# Patient Record
Sex: Male | Born: 1960 | Race: White | Hispanic: No | Smoking: Current every day smoker
Health system: Southern US, Community
[De-identification: ages and names within clinical notes are randomized; demographics above are authoritative.]

## PROBLEM LIST (undated history)

## (undated) DIAGNOSIS — C801 Malignant (primary) neoplasm, unspecified: Secondary | ICD-10-CM

## (undated) DIAGNOSIS — I4891 Unspecified atrial fibrillation: Secondary | ICD-10-CM

## (undated) HISTORY — PX: TEMPOROMANDIBULAR JOINT SURGERY: SHX35

## (undated) HISTORY — PX: BELOW KNEE LEG AMPUTATION: SUR23

---

## 2005-06-13 ENCOUNTER — Emergency Department: Payer: Self-pay | Admitting: General Practice

## 2009-09-04 ENCOUNTER — Ambulatory Visit: Payer: Self-pay | Admitting: Pain Medicine

## 2011-02-16 ENCOUNTER — Emergency Department: Payer: Self-pay | Admitting: Internal Medicine

## 2011-08-21 ENCOUNTER — Emergency Department: Payer: Self-pay | Admitting: Unknown Physician Specialty

## 2011-08-21 LAB — URINALYSIS, COMPLETE
Bacteria: NONE SEEN
Bilirubin,UR: NEGATIVE
Glucose,UR: NEGATIVE mg/dL (ref 0–75)
Ketone: NEGATIVE
Leukocyte Esterase: NEGATIVE
Ph: 7 (ref 4.5–8.0)
Protein: NEGATIVE
RBC,UR: 1 /HPF (ref 0–5)
Squamous Epithelial: NONE SEEN
WBC UR: NONE SEEN /HPF (ref 0–5)

## 2011-08-21 LAB — BASIC METABOLIC PANEL
BUN: 15 mg/dL (ref 7–18)
Calcium, Total: 9.4 mg/dL (ref 8.5–10.1)
Chloride: 101 mmol/L (ref 98–107)
Co2: 31 mmol/L (ref 21–32)
EGFR (African American): >60
EGFR (Non-African Amer.): >60
Glucose: 89 mg/dL (ref 65–99)
Potassium: 3.6 mmol/L (ref 3.5–5.1)
Sodium: 138 mmol/L (ref 136–145)

## 2011-08-21 LAB — CBC
HCT: 49.5 % (ref 40.0–52.0)
MCH: 31.3 pg (ref 26.0–34.0)
MCHC: 34 g/dL (ref 32.0–36.0)
Platelet: 86 10*3/uL — ABNORMAL LOW (ref 150–440)
RDW: 13.7 % (ref 11.5–14.5)

## 2011-08-21 LAB — HEPATIC FUNCTION PANEL A (ARMC)
Albumin: 3.9 g/dL (ref 3.4–5.0)
Alkaline Phosphatase: 144 U/L — ABNORMAL HIGH (ref 50–136)
Bilirubin, Direct: <0.1 mg/dL (ref 0.00–0.20)
Bilirubin,Total: 0.6 mg/dL (ref 0.2–1.0)
SGPT (ALT): 27 U/L
Total Protein: 7.4 g/dL (ref 6.4–8.2)

## 2011-08-21 LAB — TROPONIN I: Troponin-I: <0.02 ng/mL

## 2011-08-21 LAB — CK TOTAL AND CKMB (NOT AT ARMC)
CK, Total: 191 U/L (ref 35–232)
CK-MB: 1.1 ng/mL (ref 0.5–3.6)

## 2012-03-22 ENCOUNTER — Inpatient Hospital Stay: Payer: Self-pay | Admitting: Family Medicine

## 2012-03-22 LAB — COMPREHENSIVE METABOLIC PANEL
Anion Gap: 10 (ref 7–16)
BUN: 21 mg/dL — ABNORMAL HIGH (ref 7–18)
Calcium, Total: 8.6 mg/dL (ref 8.5–10.1)
Co2: 20 mmol/L — ABNORMAL LOW (ref 21–32)
EGFR (African American): 58 — ABNORMAL LOW
EGFR (Non-African Amer.): 50 — ABNORMAL LOW
Glucose: 103 mg/dL — ABNORMAL HIGH (ref 65–99)
SGPT (ALT): 36 U/L (ref 12–78)
Sodium: 137 mmol/L (ref 136–145)
Total Protein: 7.6 g/dL (ref 6.4–8.2)

## 2012-03-22 LAB — CBC
HCT: 47.1 % (ref 40.0–52.0)
HGB: 16.3 g/dL (ref 13.0–18.0)
MCH: 31.7 pg (ref 26.0–34.0)
MCHC: 34.6 g/dL (ref 32.0–36.0)
MCV: 92 fL (ref 80–100)
RBC: 5.14 10*6/uL (ref 4.40–5.90)

## 2012-03-22 LAB — URINALYSIS, COMPLETE
Bacteria: NONE SEEN
Glucose,UR: NEGATIVE mg/dL (ref 0–75)
Ketone: NEGATIVE
Leukocyte Esterase: NEGATIVE
Specific Gravity: 1.003 (ref 1.003–1.030)

## 2012-03-22 LAB — CK TOTAL AND CKMB (NOT AT ARMC)
CK, Total: 71 U/L (ref 35–232)
CK-MB: <0.5 ng/mL — ABNORMAL LOW (ref 0.5–3.6)

## 2012-03-23 LAB — CBC WITH DIFFERENTIAL/PLATELET
Basophil #: 0 10*3/uL (ref 0.0–0.1)
Basophil %: 0.2 %
Eosinophil #: 0 10*3/uL (ref 0.0–0.7)
Eosinophil %: 0 %
HCT: 43.8 % (ref 40.0–52.0)
HGB: 14.7 g/dL (ref 13.0–18.0)
Lymphocyte #: 0.9 10*3/uL — ABNORMAL LOW (ref 1.0–3.6)
Lymphocyte %: 7.3 %
MCH: 31.4 pg (ref 26.0–34.0)
Monocyte #: 0.3 x10 3/mm (ref 0.2–1.0)
Monocyte %: 2.3 %
Neutrophil %: 90.2 %
Platelet: 146 10*3/uL — ABNORMAL LOW (ref 150–440)
RBC: 4.69 10*6/uL (ref 4.40–5.90)
WBC: 12.8 10*3/uL — ABNORMAL HIGH (ref 3.8–10.6)

## 2012-03-23 LAB — COMPREHENSIVE METABOLIC PANEL
Anion Gap: 8 (ref 7–16)
Calcium, Total: 8.7 mg/dL (ref 8.5–10.1)
Co2: 21 mmol/L (ref 21–32)
EGFR (African American): >60
EGFR (Non-African Amer.): >60
Potassium: 4.3 mmol/L (ref 3.5–5.1)
SGOT(AST): 17 U/L (ref 15–37)
Sodium: 140 mmol/L (ref 136–145)
Total Protein: 6.9 g/dL (ref 6.4–8.2)

## 2012-03-23 LAB — CK TOTAL AND CKMB (NOT AT ARMC)
CK, Total: 48 U/L (ref 35–232)
CK-MB: <0.5 ng/mL — ABNORMAL LOW (ref 0.5–3.6)

## 2012-03-23 LAB — APTT
Activated PTT: 154.5 secs — ABNORMAL HIGH (ref 23.6–35.9)
Activated PTT: 101.2 secs — ABNORMAL HIGH (ref 23.6–35.9)

## 2013-01-03 ENCOUNTER — Emergency Department: Payer: Self-pay | Admitting: Internal Medicine

## 2014-07-01 NOTE — Consult Note (Signed)
Brief Consult Note: Diagnosis: tobacco abuse/htn/hyperlipdemia/anxiety/cough/afib.   Patient was seen by consultant.   Recommend further assessment or treatment.   Comments: 54 yo male with history of multiple medical problems including pvd s/p bilateral le amputations, history of paroxysmal afib followed closely by Dr. Willis Modena at [facility] who was admitted with abdominal pain, couigh, diarrhea, shortness of breath. He was noted to be in afib with fvr. He takes flecainaide for his afib dosing it q 6 hours. He states he is compliant. He was offerred ablation and has deferred thus far but states today he wants me to contact Dr. Willis Modena and discuss afib ablation. Will review chart and meds and attempt to contact Dr. Willis Modena. Full note to follow. Would continue with current meds except will discontinue digoxin and attempt to use metoprolol while on flecainide due to possible 1:1 conduction if patient develops flutter on flecainide..  Electronic Signatures: Teodoro Spray (MD)  (Signed 13-Jan-14 15:30)  Authored: Brief Consult Note   Last Updated: 13-Jan-14 15:30 by Teodoro Spray (MD)

## 2014-07-01 NOTE — Discharge Summary (Signed)
PATIENT NAME:  Dylan Clark, Dylan Clark MR#:  [id number] DATE OF BIRTH:  1960/12/08  DATE OF ADMISSION:  03/22/2012 DATE OF DISCHARGE:  03/24/2012  ADMISSION DIAGNOSIS AND CHIEF COMPLAINT: Nausea, vomiting, diarrhea and tachycardia.   FINAL DIAGNOSES: 1. Atrial fibrillation with rapid ventricular response.  2. Dehydration.  3. Acute kidney failure.  4. Chest pain and shortness of breath.  5. Ongoing tobacco abuse.  6. Chronic joint pain.  7. Anxiety disorder and also some PTSD component.   CONSULTANT: Bartholome Bill, MD  DISCHARGE MEDICATIONS:  1. Alprazolam 1 mg 4 times a day. 2. Enalapril 5 mg 4 times a day. 3. Hydroxyzine 25 mg once daily. 4. Promethazine 25 mg twice daily. 5. Simvastatin 40 mg once daily. 6. Afrin p.r.n.  7. Multivitamins once daily.  8. Furosemide 20 mg 2 tablets 4 times a day. 9. Zantac 150 mg as needed.  10. Testosterone injections once a week. 11. Flecainide 100 mg every 12 hours. 12. Morphine sulfate 15 mg 12 hour release take one every 8 hours.  13. Metoprolol 25 mg twice daily. 14. Advair Diskus 250/50 mg twice daily. 15. Zithromax 500 mg once a day for another 8 days. 16. Xarelto 20 mg once a day.   IMPORTANT RESULTS: Echocardiogram showed left ventricular grossly normal size, no thrombus, ejection fraction of 55% and normal left ventricular wall thickness.   Admission labs: Creatinine 1.57, glucose 103 and BUN 21. Electrolytes normal with sodium 137, potassium 3.5 and bilirubin of 1.2. Troponins were all negative x 3. White count was 16,000 on admission with a hemoglobin of 16 and platelets of 78.   Urinalysis was within normal limits.   Followup labs: White blood count came down to 12.8. As mentioned above, Troponins x 3 were negative. His glucose was 187 at discharge and his creatinine normalized to 1.16. His platelets improved from 78 down to 146.   Chest x-ray shows mostly changes of COPD, maybe acute bronchitis, but no pneumonia.   HOSPITAL COURSE:  Mr. Kozub is very nice 54 year old gentleman with history of degenerative disk disease, hypertension, hyperlipidemia, chronic atrial fibrillation on propafenone, tobacco abuse, amputee and peripheral vascular disease who presented to the hospital with the complaint of feeling not so well, having nausea, vomiting and diarrhea and being dehydrated. The problem started 4 days prior to the admission. He was trying not to overdo things and just relax, but overall became dehydrated for not being able to drink plenty fluids. The patient started having some increased pain. He has chronic pain, especially in his back, but the pain became a little bit more intolerable. He also noticed some palpitations, chest tightness and his heart rate was increased and irregular. He has history of atrial fibrillation and occasionally has palpitations this way, although they go away quickly. This time they were sustained. The patient started to become dizzy and because of all of this decided to come to the ER. In the ER, he was found to be short of breath requiring 2 liters of oxygen and his heart rate was around 130 for what he was admitted for control of his symptoms. He was found to have low blood pressures of 54/42 and a heart rate of 135. His temperature was 98.1 on admission. He looked dehydrated. IV fluids were given and he was put on antibiotics. There was a question about the patient possibly having a pulmonary embolism due to hypoxemia, hypotension and tachycardia. VQ scan was performed and he was put on a heparin drip. His platelets  were very decreased prior to starting heparin drip, but on repeat platelet count actually they were better at 146. Prior to that they were in the 78. The patient's VQ scan showed homogeneous ventilation with homogenous perfusion and no mismatch for what it was interpreted as negative or normal VQ scan. The patient was able to be weaned off oxygen quickly and his blood pressure improved after the IV  fluids were given. Apparently he was having diarrhea for 4 or 5 days and not hydrating himself very well, but IV fluids and oral hydration seemed to improve his problem. His blood pressure medications were held, but at discharge his blood pressures were 160s over 94 for what we restarted medications. He was taking propafenone, Inderal and enalapril. Due to these problems and his atrial fibrillation being present again, the patient was changed from Inderal to metoprolol and continued propafenone. Dr. Ubaldo Glassing was kind to see the patient on this admission. He discussed the case with Dr. Willis Modena as far possible ablation for this patient and the patient is going to be referred back to [facility] for this. The patient also had transformation back to normal sinus rhythm during this hospitalization. Due to all of this and to possible ablation, Dr. Ubaldo Glassing decided to put the patient on anticoagulation. We talked about this extensively. The patient has a very low CHADS score of 1, based on hypertension. He also has peripheral arterial disease, but he does not have significant diabetes or CHF and he is young, 54 years old. Dr. Ubaldo Glassing thought that it would be beneficial to anticoagulate him if he is going to be having an ablation for which the patient is discharged on Xarelto.   TIME SPENT: I spent about 95 minutes with this patient yesterday trying to arrange his care and also talking with insurance for prior authorization of his medications.  ____________________________ [place] Is[place]ro Sink, MD rsg:sb D: 03/25/2012 06:51:56 ET T: 03/25/2012 08:56:42 ET JOB#: [id number]  cc: [place] Sink, MD, <Dictator> Guadalupe Maple, MD Dr. Willis Modena [facility] Cardiology Delanie Tirrell America Brown MD ELECTRONICALLY SIGNED 04/03/2012 10:58

## 2014-07-01 NOTE — H&P (Signed)
PATIENT NAME:  Dylan Clark, Dylan Clark MR#:  [id number] DATE OF BIRTH:  May 15, 1960  DATE OF ADMISSION:  03/22/2012  PRIMARY CARE PHYSICIAN: [facility], Dr. Jory Sims  HISTORY OF PRESENT ILLNESS: The patient is a 54 year old Caucasian male with past medical history significant for history of degenerative disk disease, hypertension, hyperlipidemia, history of chronic a fib, tobacco abuse, presented to the hospital with complaints of not feeling well. Apparently the patient started having nausea, vomiting, as well as diarrhea and fever approximately 4 days ago. He felt somewhat better on Friday, a day or two ago, however still very weak. He was able to get out from the house; however, after returning back he had recurrent nausea and vomiting. Yesterday, he was taking it easy, however, walked a little bit to his friend's house. While at the friend's house, he started having problems with increasing back pain. The pain was so uncomfortable that he had to return back home. At home, he noted to have also chest pains, tightness in the chest, as well as increased heart rate and irregular heart rate. He has history of atrial fibrillation and frequently feels whenever his heart rate goes from sinus to atrial fibrillation. He felt very dizzy and very dry. He drank plenty of fluids, however, decided to come to the Emergency Room with continuous chest pains which he describes as pleuritic chest pains, increasing with deep inspiration. He was also having some shortness of breath, especially if he tried to lay down flat; he was short of breath and felt discomfort in his chest. He was able to sleep for a few hours on 2 pillows; however, because of discomfort in his chest as well as shortness of breath he decided to come to the Emergency Room for further evaluation. In the Emergency Room, he was noted to be in atrial fib, rate of 130. His systolic blood pressure was also in the 50s, and Hospitalist services were contacted for admission.  Now after 4 liters of IV fluid infusion, his heart rate remains high at 017P, and his systolic blood pressure remains low at around 85 maximum level. His oxygenation is satisfactory. It was 95% on room to 2 liters of oxygen through nasal cannula. He does not feel any better yet, and he is complaining of some chest pains as well as some back pains in the lower back.   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 The patient was admitted in December 2012 at [facility] for afib. He had a stress test done at the same time which was negative. He also had right TMJ surgery, history of deviated septum of his nose and nasal congestion intermittently, dependence on Afrin spray.   PAST SURGICAL HISTORY: As above.   ALLERGIES: CODEINE, WHICH GIVES HIM, STOMACH CRAMPS.   FAMILY HISTORY: The patient's sister had diabetes at the age of 73. The patient's father had CVA at the age of 39, died at the age of 39. The patient's mother has dementia, died at age of 25. The patient's sister at 69 has dementia. The  patient's brother had a kidney transplant for unknown reason. The patient's other brother had coronary artery bypass grafting at the age of 45.   SOCIAL HISTORY: The patient is divorced x 2. Smokes 1-1/2 packs a day for 17 years.  He used to be drunk in the past, however, stopped drinking a long time ago. He is on Disability. 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   REVIEW OF SYSTEMS: Positive for high fevers, started approximately 4 days ago. Even today he had fevers at 100.8 as well as chills here in the hospital, feeling fatigued and weak, having pains in the chest as well as pains in the lower back, some blurring of vision and double vision intermittently which he attributes to a cataract on the right side, a deviated septum in his nose, also sinus congestion and will be hooked up to nasal spray, according to him, some cough with white phlegm, as well as shortness of breath and tightness in his chest, atrial fibrillation, arrhythmias and feeling presyncopal today, nausea and vomiting intermittently, intermittent lower extremity edema for which he is taking Lasix, however, he has been taking Lasix also even if he is not able to eat or drink well over the past 4 days. He admits of intermittent rectal bleeding which he attributes to in hemorrhoids. He admits of having some problems with urination, difficulty expelling urine due to  Peyronie's disease.  He has back problems, cervical as well as lumbar spine disk problems, degenerative disk disease, also bulging disk which contributes to upper extremity weakness, as well as lower extremity weakness, as well as numbness and pain. Otherwise:   CONSTITUTIONAL: Denies any weight problems with loss or gain.  EYES: In regards to eyes, denies any redness, inflammation, or glaucoma.  ENT: Denies any tinnitus, allergies, epistaxis, sinus pain, dentures, difficulty swallowing. RESPIRATORY: Denies any wheezes, hemoptysis, asthma or COPD.  CARDIOVASCULAR: Denies any orthopnea, dyspnea on exertion. Admits of palpitations as well as feeling presyncopal, however, admits of sleeping on 2 pillows earlier today.  GASTROINTESTINAL: Denies any hematemesis, constipation. Admits of having recent nausea, vomiting, and diarrhea.  GENITOURINARY: Denies dysuria, hematuria, frequency or  incontinence. ENDOCRINOLOGY: Denies any polydipsia, nocturia, thyroid problems, heat or cold intolerance or thirst.  HEMATOLOGIC: Denies any anemia, easy bruising, bleeding, or swollen glands.  SKIN: Denies any acne, rashes, lesions, or change in moles.  MUSCULOSKELETAL: Denies arthritis, cramps, swelling.  NEUROLOGIC: Denies numbness, epilepsy, or tremor.  PSYCHIATRIC: Denies anxiety or insomnia.    PHYSICAL EXAMINATION: VITAL SIGNS: On arrival to the hospital, temperature was 98.1, pulse 135, respiration rate 24, blood pressure 54/42, saturation 95% on room.  GENERAL: This is a well-developed, well-nourished obese Caucasian male in no significant distress, comfortable on the stretcher.   HEENT: Her pupils are equal and reactive to light. Extraocular movements are intact. No icterus or conjunctivitis.  Has normal hearing.  No pharyngeal erythema. Mucosa is moist.  NECK: No masses, supple, nontender. Thyroid is not enlarged. No adenopathy. No JVD or carotid bruits bilaterally. Full range of motion.  LUNGS: Diminished breath sounds. A few crackles were heard bilaterally at bases, however, no rhonchi or wheezing. No labored inspirations. No increased effort. No dullness to percussion, not in overt respiratory distress.  CARDIOVASCULAR: S1, S2 appreciated, distant, irregularly irregular. PMI is not lateralized. Chest is nontender to palpation. Pedal pulses 1+, no lower extremity edema, calf tenderness or cyanosis. The patient does have, however, a right BKA  and prosthesis. ABDOMEN: Soft, nontender, bowel sounds are present.  No splenomegaly or masses were noted.  RECTAL: Deferred. MUSCULOSKELETAL: Muscle strength: Able to move all extremities.  No cyanosis, degenerative joint disease or kyphosis noted.  The patient does have significant pain on palpation of his spine, especially lower thoracic as well as lumbar spine all the way to the lower coccygeal area.  SKIN: Did not reveal any rashes, lesions,  erythema, nodularity, indurations. It was warm and dry to palpation.  No adenopathy in the cervical region.  NEUROLOGICAL:  Cranial nerves were intact.  No dysarthria or aphasia.   PSYCHIATRIC: The patient is alert and oriented to time, person and place, cooperative. Memory is good.  No significant confusion, agitation or depression noted.    LABORATORY, DIAGNOSTIC AND RADIOLOGICAL DATA:  Glucose 103, BUN and creatinine were 21 and 1.57, otherwise BMP was unremarkable, however, the patient's bicarbonate level is low at 20. Estimated GFR for non-African American would be 50. Liver enzymes revealed total bilirubin of 1.2, otherwise liver enzymes were unremarkable. CK total was 71, MB fraction was less than 0.5, and troponin was less than 0.02, all of them normal. White blood cell count was elevated to 16.0, hemoglobin was 16.3, platelet count 78. Urinalysis: Straw clear urine, negative for glucose, bilirubin or ketones, specific gravity 1.003, pH was 6.0, negative for blood, protein, nitrites or leukocyte esterase, no red blood cells, no white blood cells, no bacteria or epithelial cells were noted. Influenza test was negative for influenza A as well as B.   EKG revealed afib at a rate of 126, left axis deviation, nonspecific ST-T changes. No acute changes were noted. Chest x-ray, portable single view on 03/22/2012, revealed no definite evidence of acute cardiopulmonary abnormality. Cannot exclude minimal subsegmental atelectasis bilaterally at the left lung base, according to the radiologist.   ASSESSMENT AND PLAN: 1. Chest pain: Admit the patient to a medical floor. It is pleuritic, according to the patient's evaluation. The patient apparently had a stress test in December 2012 at [facility], and it was negative, per his recollection. I am admitting him for questionable pulmonary embolism evaluation. We will start the patient on heparin IV drip, and we will get V/Q scan in the morning. We will continue heparin  for now.  2. Hypoxia, questionable chronic obstructive pulmonary disease: The patient does have a long history of tobacco abuse. We will add Advair as well as DuoNebs and steroids. We will continue oxygen therapy for now.  3. Atrial fibrillation, rapid ventricular response: We will continue flecainide. We  will also add Digoxin. I was not able to use calcium channel blockers or beta blockers due to hypotension. We may need to start the patient on an amiodarone drip.  4. Profound hypertension: Questionable sepsis versus PE-related. We will get blood cultures done. We will continue IV fluids at a high rate. We will also start the patient on antibiotic therapy. We will get V/Q scan and will continue heparin to rule out pulmonary embolism. The patient may need pressors if his MAP is less than 60.  5. Acute renal failure: We will continue IV fluids. We will check urine cultures as well.  6. Back pain: We will continue morphine as needed.  7. Tobacco abuse: Nicotine replacement therapy will be initiated. This was discussed with the patient for 5 minutes, and he wanted to quit.     MEDICATIONS:   1. Afrin 1 spray once a day.  2. Alprazolam 1 mg 4 times daily.  3. Enalapril 5 mg p.o. 4 times daily.  4. Flecainide 100 mg p.o. twice daily.  5. Furosemide 20 mg, 2 tablets 4 times day.  6. Hydroxyzine hydrochloride 25 mg p.o. once daily as needed.  7. Morphine, which is MS Contin, 15 mg p.o. every 8 hours.  8. Multivitamin once daily.  9. Promethazine 25 mg p.o. twice daily as needed.  10. Simvastatin 40 mg p.o. daily.  11. Testosterone 1 mL injectable once a week, which is 200 mg.  12. Zantac 150 mg p.o. daily as needed.   TIME SPENT: One hour.   ____________________________ Theodoro Grist, MD rv:cb D: 03/22/2012 16:26:00 ET T: 03/22/2012 18:17:27 ET JOB#: [id number]  cc: Theodoro Grist, MD, <Dictator> [facility], Jory Sims, MD [place] MD ELECTRONICALLY SIGNED 04/30/2012 14:13

## 2014-08-15 ENCOUNTER — Encounter: Payer: Self-pay | Admitting: *Deleted

## 2014-08-15 ENCOUNTER — Emergency Department
Admission: EM | Admit: 2014-08-15 | Discharge: 2014-08-15 | Disposition: A | Discharge status: Home / Self Care | Payer: Medicare Other | Attending: Emergency Medicine | Admitting: Emergency Medicine

## 2014-08-15 DIAGNOSIS — R339 Retention of urine, unspecified: Secondary | ICD-10-CM | POA: Diagnosis present

## 2014-08-15 DIAGNOSIS — Z880 Allergy status to penicillin: Secondary | ICD-10-CM | POA: Insufficient documentation

## 2014-08-15 DIAGNOSIS — R338 Other retention of urine: Secondary | ICD-10-CM

## 2014-08-15 DIAGNOSIS — Z720 Tobacco use: Secondary | ICD-10-CM | POA: Insufficient documentation

## 2014-08-15 HISTORY — DX: Malignant (primary) neoplasm, unspecified: C80.1

## 2014-08-15 HISTORY — DX: Unspecified atrial fibrillation: I48.91

## 2014-08-15 LAB — URINALYSIS COMPLETE WITH MICROSCOPIC (ARMC ONLY)
BILIRUBIN URINE: NEGATIVE
Bacteria, UA: NONE SEEN
GLUCOSE, UA: NEGATIVE mg/dL
Ketones, ur: NEGATIVE mg/dL
Leukocytes, UA: NEGATIVE
NITRITE: NEGATIVE
PROTEIN: 30 mg/dL — AB
Specific Gravity, Urine: 1.011 (ref 1.005–1.030)
pH: 6 (ref 5.0–8.0)

## 2014-08-15 MED ORDER — LIDOCAINE HCL 2 % EX GEL
CUTANEOUS | Status: AC
  Filled 2014-08-15: qty 5

## 2014-08-15 MED ORDER — OXYCODONE-ACETAMINOPHEN 5-325 MG PO TABS: 2.0000 | ORAL_TABLET | Freq: Once | ORAL | Status: DC

## 2014-08-15 MED ORDER — LIDOCAINE HCL 2 % EX GEL
CUTANEOUS | Status: AC
  Administered 2014-08-15: 21:00:00
  Filled 2014-08-15: qty 10

## 2014-08-15 MED ORDER — OXYCODONE HCL 5 MG PO TABS
10.0000 mg | ORAL_TABLET | Freq: Once | ORAL | Status: AC
  Administered 2014-08-15: 10 mg via ORAL

## 2014-08-15 MED ORDER — OXYCODONE HCL 5 MG PO TABS
ORAL_TABLET | ORAL | Status: AC
  Administered 2014-08-15: 10 mg via ORAL
  Filled 2014-08-15: qty 2

## 2014-08-15 NOTE — ED Notes (Signed)
Pt was seen at [facility] for an appointment, pt dx: enlarged prostate, pt given antibiotic, pt unable to urinate since 1400

## 2014-08-15 NOTE — ED Provider Notes (Signed)
[facility] Emergency Department Provider Note     Time seen: ----------------------------------------- 8:37 PM on 08/15/2014 -----------------------------------------    I have reviewed the triage vital signs and the nursing notes.   HISTORY  Chief Complaint Urinary Retention    HPI Dylan Clark is a 54 y.o. male resents ER with acute urinary retention. Patient states last urinated around 2:00. Has some abdominal pain secondary to same. Earlier he urinated mostly blood. Complains of lower abdominal discomfort, nothing makes it better or worse at this time. Has had have a Foley catheter in the past after procedures. Was seen at [facility] for an appointment and treat for prostatitis is on Levaquin currently   Past Medical History  Diagnosis Date  . Atrial fibrillation   . Cancer     There are no active problems to display for this patient.   No past surgical history on file.  Allergies Amoxicillin; Augmentin; Fish allergy; Adhesive; Codeine; Mirtazapine; Niacin and related; and Tylenol  Social History History  Substance Use Topics  . Smoking status: Current Every Day Smoker -- 1.00 packs/day  . Smokeless tobacco: Not on file  . Alcohol Use: No    Review of Systems Constitutional: Negative for fever. Eyes: Negative for visual changes. ENT: Negative for sore throat. Cardiovascular: Negative for chest pain. Respiratory: Negative for shortness of breath. Gastrointestinal: Negative for abdominal pain, vomiting and diarrhea. Genitourinary: Inability to urinate Musculoskeletal: Negative for back pain. Skin: Negative for rash. Neurological: Negative for headaches, focal weakness or numbness.  10-point ROS otherwise negative.  ____________________________________________   PHYSICAL EXAM:  VITAL SIGNS: ED Triage Vitals  Enc Vitals Group     BP 08/15/14 2027 139/86 mmHg     Pulse Rate 08/15/14 2027 71     Resp 08/15/14 2027 20     Temp  08/15/14 2027 98.7 F (37.1 C)     Temp Source 08/15/14 2027 Oral     SpO2 08/15/14 2027 99 %     Weight 08/15/14 2027 288 lb (130.636 kg)     Height 08/15/14 2027 6\' 6"  (1.981 m)     Head Cir --      Peak Flow --      Pain Score 08/15/14 2028 7     Pain Loc --      Pain Edu? --      Excl. in [place] Constitutional: Alert and oriented. Well appearing and in no distress. Eyes: Conjunctivae are normal. PERRL. Normal extraocular movements. ENT   Head: Normocephalic and atraumatic.   Nose: No congestion/rhinnorhea.   Mouth/Throat: Mucous membranes are moist.   Neck: No stridor. Hematological/Lymphatic/Immunilogical: No cervical lymphadenopathy. Cardiovascular: Normal rate, regular rhythm. Normal and symmetric distal pulses are present in all extremities. No murmurs, rubs, or gallops. Respiratory: Normal respiratory effort without tachypnea nor retractions. Breath sounds are clear and equal bilaterally. No wheezes/rales/rhonchi. Gastrointestinal: Soft and nontender. No distention. No abdominal bruits. There is no CVA tenderness. Musculoskeletal: Nontender with normal range of motion in all extremities. No joint effusions.  No lower extremity tenderness nor edema. Neurologic:  Normal speech and language. No gross focal neurologic deficits are appreciated. Speech is normal. No gait instability. Skin:  Skin is warm, dry and intact. No rash noted. Psychiatric: Mood and affect are normal. Speech and behavior are normal. Patient exhibits appropriate insight and judgment.   ____________________________________________  ED COURSE:  Pertinent labs & imaging results that were available during my care of the patient were reviewed  by me and considered in my medical decision making (see chart for details). Patient will need Foley catheter  Foley catheter placed, copious urine output. ____________________________________________    LABS (pertinent positives/negatives)  Labs  Reviewed  URINALYSIS COMPLETEWITH MICROSCOPIC ([facility] ONLY) - Abnormal; Notable for the following:    Color, Urine YELLOW (*)    APPearance CLOUDY (*)    Hgb urine dipstick 3+ (*)    Protein, ur 30 (*)    Squamous Epithelial / LPF 0-5 (*)    All other components within normal limits    RADIOLOGY  None  ____________________________________________  FINAL ASSESSMENT AND PLAN  Acute urinary retention  Plan: Patient will continue home with Foley catheter. He states she has follow-up with internal medicine [facility] on Thursday. Perhaps they could reevaluate him and see if he can get his Foley catheter out at that time. He'll continue on Levaquin that he was placed on the day, a stable for outpatient follow-up.   Earleen Newport, MD   Earleen Newport, MD 08/15/14 [phone]

## 2014-08-15 NOTE — Discharge Instructions (Signed)
Acute Urinary Retention  Acute urinary retention is when you are unable to pee (urinate). Acute urinary retention is common in older men. Prostates can get bigger, which blocks the flow of pee.   HOME CARE  · Drink enough fluids to keep your pee clear or pale yellow.  · If you are sent home with a tube that drains the bladder (catheter), there will be a drainage bag attached to it. There are two types of bags. One is big that you can wear at night without having to empty it. One is smaller and needs to be emptied more often.  ¨ Keep the drainage bag empty.  ¨ Keep the drainage bag lower than your catheter.  · Only take medicine as told by your doctor.  GET HELP IF:  · You have a low-grade fever.  · You have spasms or you are leaking pee when you have spasms.  GET HELP RIGHT AWAY IF:   · You have chills or a fever.  · Your catheter stops draining pee.  · Your catheter falls out.  · You have increased bleeding that does not stop after you have rested and increased the amount of fluids you had been drinking.  MAKE SURE YOU:   · Understand these instructions.  · Will watch your condition.  · Will get help right away if you are not doing well or get worse.  Document Released: 08/14/2007 Document Revised: 12/16/2012 Document Reviewed: 08/06/2012  [employer]® Patient Information ©2015 [employer]. This information is not intended to replace advice given to you by your health care provider. Make sure you discuss any questions you have with your health care provider.

## 2015-10-04 ENCOUNTER — Emergency Department
Admission: EM | Admit: 2015-10-04 | Discharge: 2015-10-04 | Disposition: A | Discharge status: Home / Self Care | Payer: Medicare Other | Attending: Emergency Medicine | Admitting: Emergency Medicine

## 2015-10-04 ENCOUNTER — Emergency Department: Payer: Medicare Other

## 2015-10-04 ENCOUNTER — Encounter: Payer: Self-pay | Admitting: Emergency Medicine

## 2015-10-04 DIAGNOSIS — Y929 Unspecified place or not applicable: Secondary | ICD-10-CM | POA: Insufficient documentation

## 2015-10-04 DIAGNOSIS — F17200 Nicotine dependence, unspecified, uncomplicated: Secondary | ICD-10-CM | POA: Insufficient documentation

## 2015-10-04 DIAGNOSIS — S93601A Unspecified sprain of right foot, initial encounter: Secondary | ICD-10-CM | POA: Insufficient documentation

## 2015-10-04 DIAGNOSIS — Y999 Unspecified external cause status: Secondary | ICD-10-CM | POA: Insufficient documentation

## 2015-10-04 DIAGNOSIS — M79671 Pain in right foot: Secondary | ICD-10-CM | POA: Diagnosis present

## 2015-10-04 DIAGNOSIS — Y9301 Activity, walking, marching and hiking: Secondary | ICD-10-CM | POA: Diagnosis not present

## 2015-10-04 DIAGNOSIS — X509XXA Other and unspecified overexertion or strenuous movements or postures, initial encounter: Secondary | ICD-10-CM | POA: Diagnosis not present

## 2015-10-04 DIAGNOSIS — Z8551 Personal history of malignant neoplasm of bladder: Secondary | ICD-10-CM | POA: Diagnosis not present

## 2015-10-04 MED ORDER — OXYCODONE HCL 5 MG PO CAPS: 5.0000 mg | ORAL_CAPSULE | ORAL | 0 refills | Status: AC | PRN

## 2015-10-04 MED ORDER — KETOROLAC TROMETHAMINE 60 MG/2ML IM SOLN
60.0000 mg | Freq: Once | INTRAMUSCULAR | Status: AC
  Administered 2015-10-04: 60 mg via INTRAMUSCULAR
  Filled 2015-10-04: qty 2

## 2015-10-04 MED ORDER — IBUPROFEN 800 MG PO TABS: 800.0000 mg | ORAL_TABLET | Freq: Three times a day (TID) | ORAL | 0 refills | Status: AC | PRN

## 2015-10-04 NOTE — ED Provider Notes (Signed)
[facility] Emergency Department Provider Note  ____________________________________________  Time seen: Approximately 4:16 PM  I have reviewed the triage vital signs and the nursing notes.   HISTORY  Chief Complaint Foot Pain    HPI Dylan Clark is a 55 y.o. male who presents for evaluation of left foot and ankle pain. Patient states he was coming down the steps and twisted his ankle. Complaining of continuous pain in the foot on the lateral aspect as well as radiation up into the ankle. Positive for swelling and bruising noted.   Past Medical History:  Diagnosis Date  . Atrial fibrillation ([place])   . Cancer [facility])    bladder    There are no active problems to display for this patient.   Past Surgical History:  Procedure Laterality Date  . BELOW KNEE LEG AMPUTATION Right   . TEMPOROMANDIBULAR JOINT SURGERY      Prior to Admission medications   Medication Sig Start Date End Date Taking? Authorizing Provider  ibuprofen (ADVIL,MOTRIN) 800 MG tablet Take 1 tablet (800 mg total) by mouth every 8 (eight) hours as needed. 10/04/15   Pierce Crane Tharun Cappella, PA-C  oxycodone (OXY-IR) 5 MG capsule Take 1 capsule (5 mg total) by mouth every 4 (four) hours as needed. 10/04/15   Arlyss Repress, PA-C    Allergies Amoxicillin; Augmentin [amoxicillin-pot clavulanate]; Fish allergy; Adhesive [tape]; Codeine; Latex; Mirtazapine; Niacin and related; and Tylenol [acetaminophen]  History reviewed. No pertinent family history.  Social History Social History  Substance Use Topics  . Smoking status: Current Every Day Smoker    Packs/day: 1.00  . Smokeless tobacco: Not on file  . Alcohol use No    Review of Systems Constitutional: No fever/chills Eyes: No visual changes. ENT: No sore throat. Cardiovascular: Denies chest pain. Respiratory: Denies shortness of breath. Gastrointestinal: No abdominal pain.  No nausea, no vomiting.  No diarrhea.  No  constipation. Genitourinary: Negative for dysuria. Musculoskeletal: Positive for left foot and ankle pain. Skin: Negative for rash. Neurological: Negative for headaches, focal weakness or numbness.  10-point ROS otherwise negative.  ____________________________________________   PHYSICAL EXAM:  VITAL SIGNS: ED Triage Vitals  Enc Vitals Group     BP 10/04/15 1603 124/70     Pulse Rate 10/04/15 1603 71     Resp 10/04/15 1603 18     Temp 10/04/15 1603 98.6 F (37 C)     Temp Source 10/04/15 1603 Oral     SpO2 10/04/15 1603 97 %     Weight 10/04/15 1603 270 lb (122.5 kg)     Height 10/04/15 1603 6' 6.75" (2 m)     Head Circumference --      Peak Flow --      Pain Score 10/04/15 1607 8     Pain Loc --      Pain Edu? --      Excl. in [place]? --     Constitutional: Alert and oriented. Well appearing and in no acute distress. Musculoskeletal: Status post BKA right leg, left foot with edema and tenderness noted mild ecchymosis and bruising to the lateral aspect. Limited range of motion. Distally neurovascularly intact. Neurologic:  Normal speech and language. No gross focal neurologic deficits are appreciated. No gait instability. Skin:  Skin is warm, dry and intact. No rash noted. Psychiatric: Mood and affect are normal. Speech and behavior are normal.  ____________________________________________   LABS (all labs ordered are listed, but only abnormal results are displayed)  Labs Reviewed -  No data to display ____________________________________________  EKG   ____________________________________________  RADIOLOGY  No acute osseous findings. ____________________________________________   PROCEDURES  Procedure(s) performed: None  Critical Care performed: No  ____________________________________________   INITIAL IMPRESSION / ASSESSMENT AND PLAN / ED COURSE  Pertinent labs & imaging results that were available during my care of the patient were reviewed by me  and considered in my medical decision making (see chart for details).    Clinical Course   Acute left foot sprain. Rx given her Roxicodone 5 mg and ibuprofen 800 mg. Patient follow-up PCP or return to ER with any worsening symptomology. He states has an orthopedic physician in [facility] and will follow up with him. ____________________________________________   FINAL CLINICAL IMPRESSION(S) / ED DIAGNOSES  Final diagnoses:  Foot sprain, right, initial encounter     This chart was dictated using voice recognition software/Dragon. Despite best efforts to proofread, errors can occur which can change the meaning. Any change was purely unintentional.    Arlyss Repress, PA-C 10/04/15 1726    Harvest Dark, MD 10/04/15 2012

## 2015-10-04 NOTE — ED Triage Notes (Signed)
Pt was walking down stairs last Wednesday and left foot popped. Swelling to left outer foot.

## 2015-10-04 NOTE — ED Notes (Signed)
Pt verbalized understanding of discharge instructions. NAD at this time. 

## 2018-01-11 IMAGING — DX DG ANKLE COMPLETE 3+V*L*
3 series · 3 of 3 positions shown · non-contrast
Comparison: None.

CLINICAL DATA: Fall, foot/ankle pain

EXAM:
LEFT ANKLE COMPLETE - 3+ VIEW

[ankle ap]
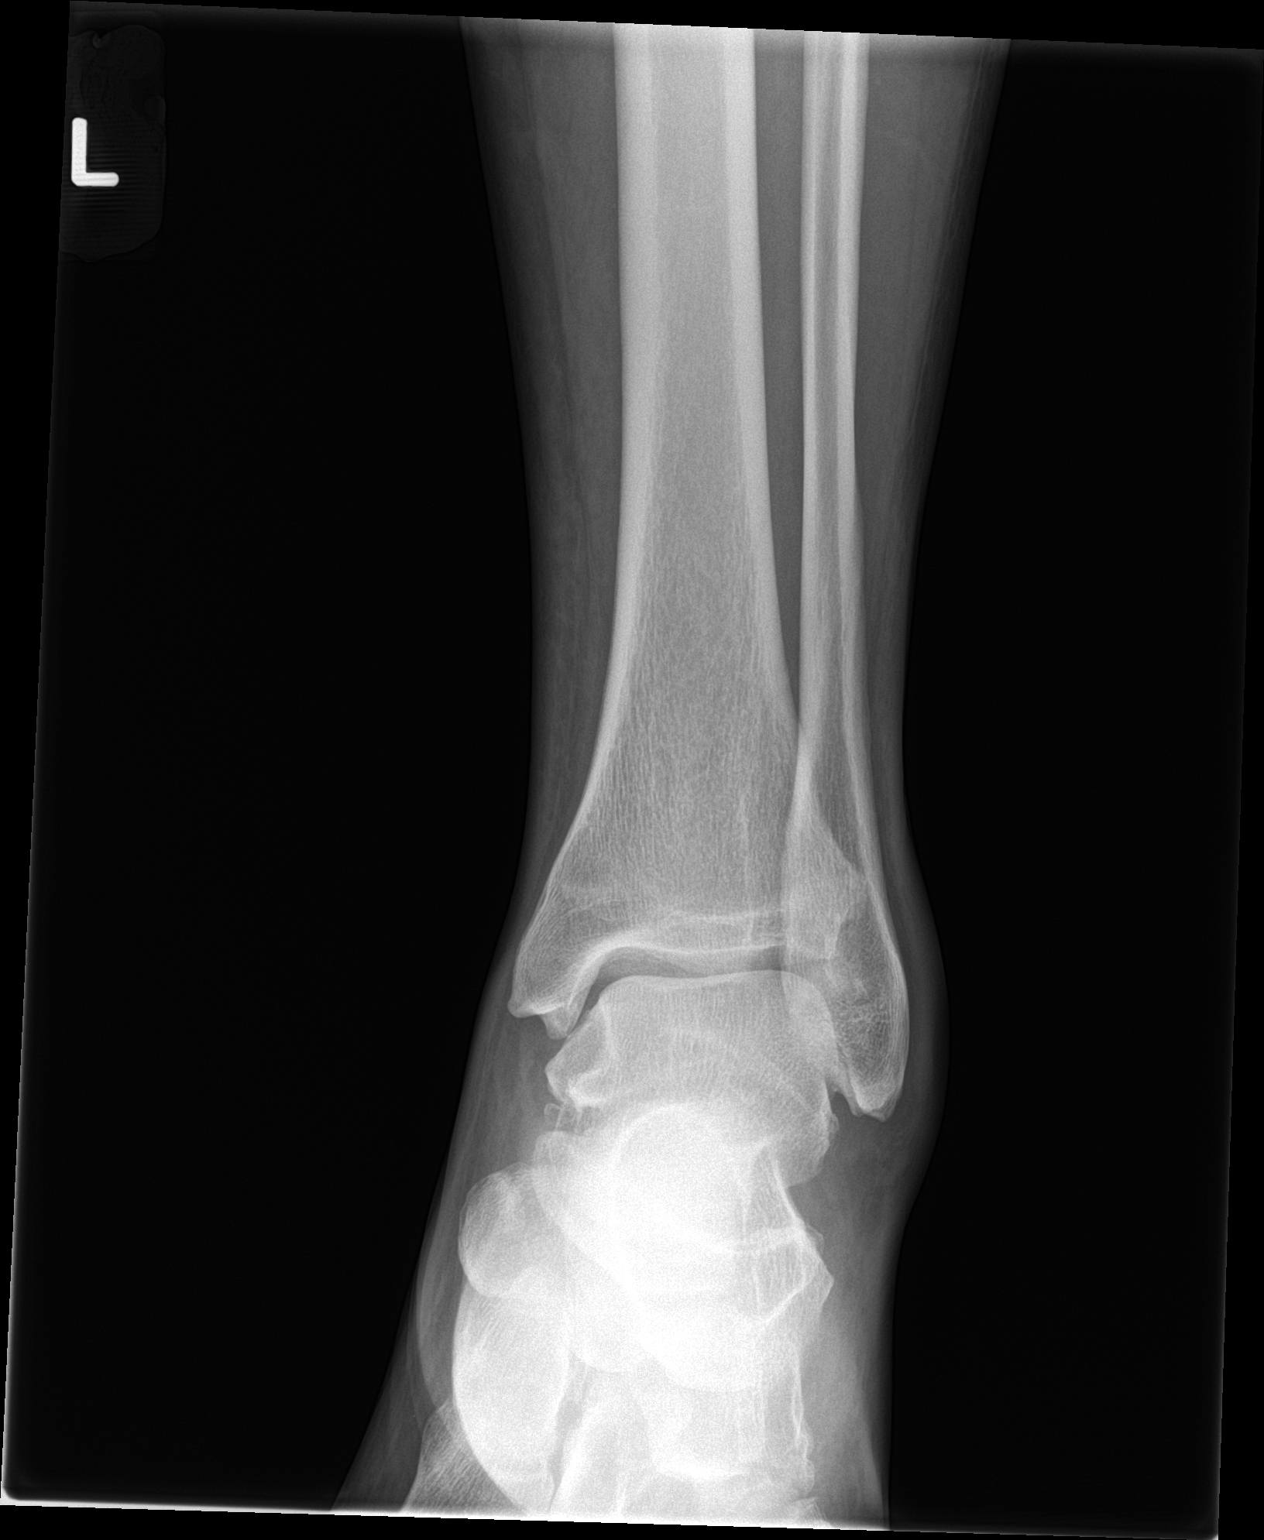

[ankle obl]
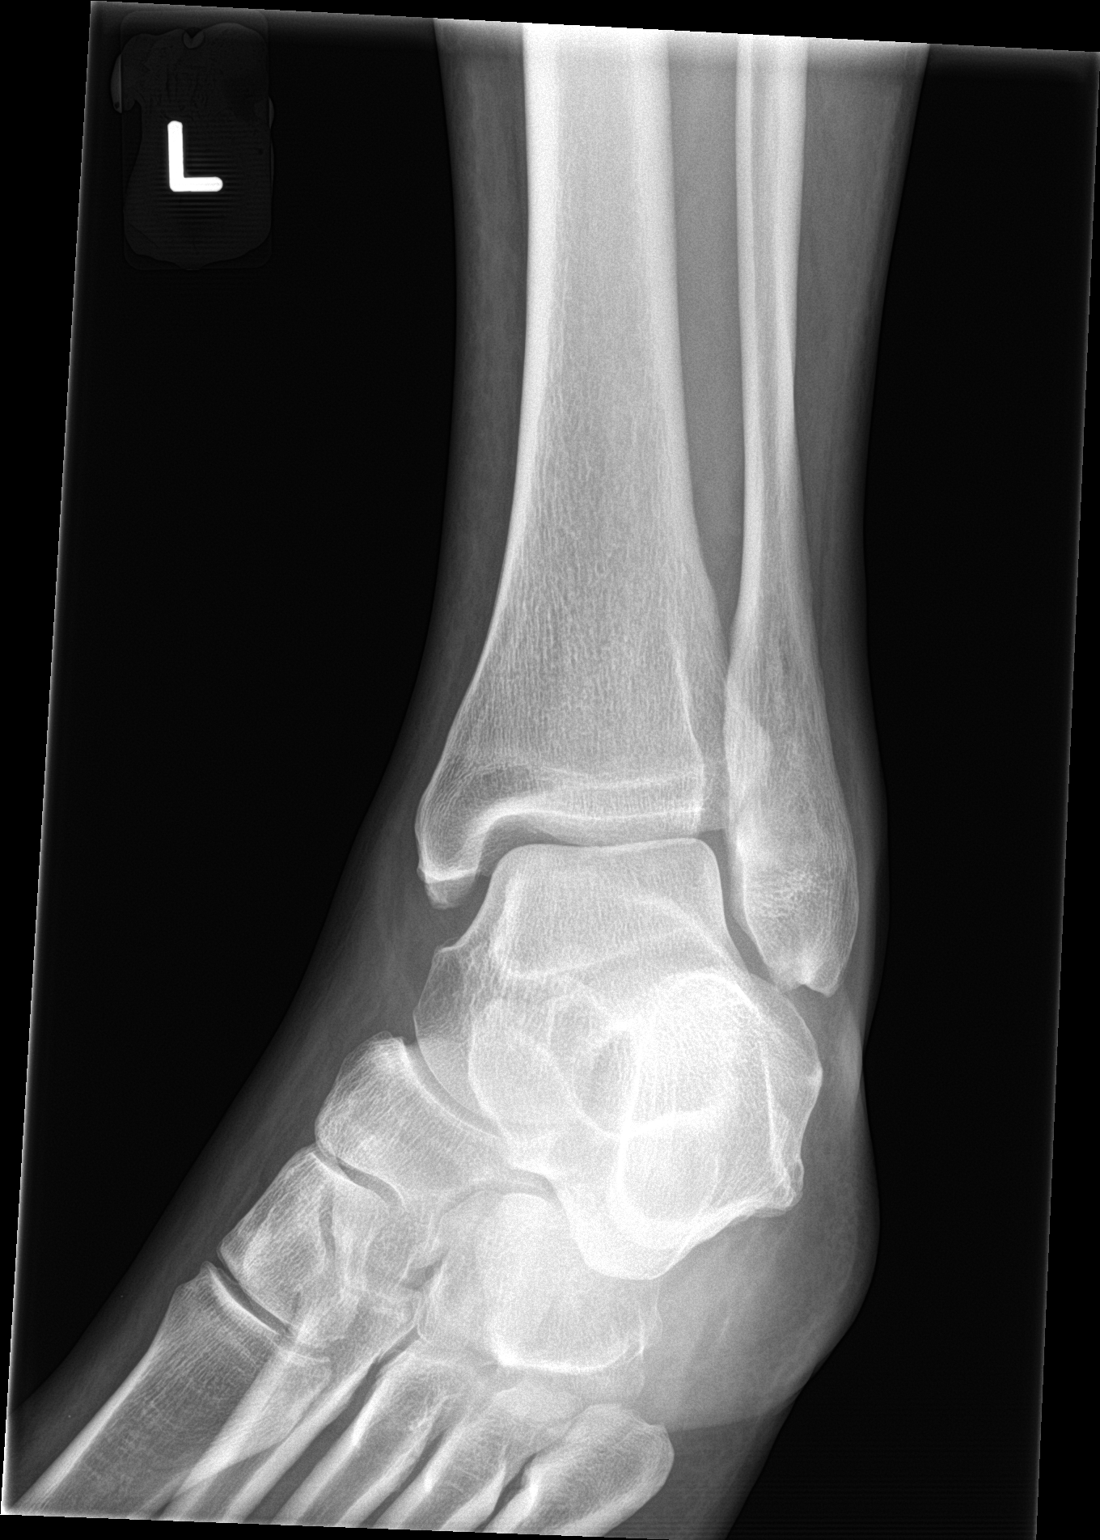

[ankle lat]
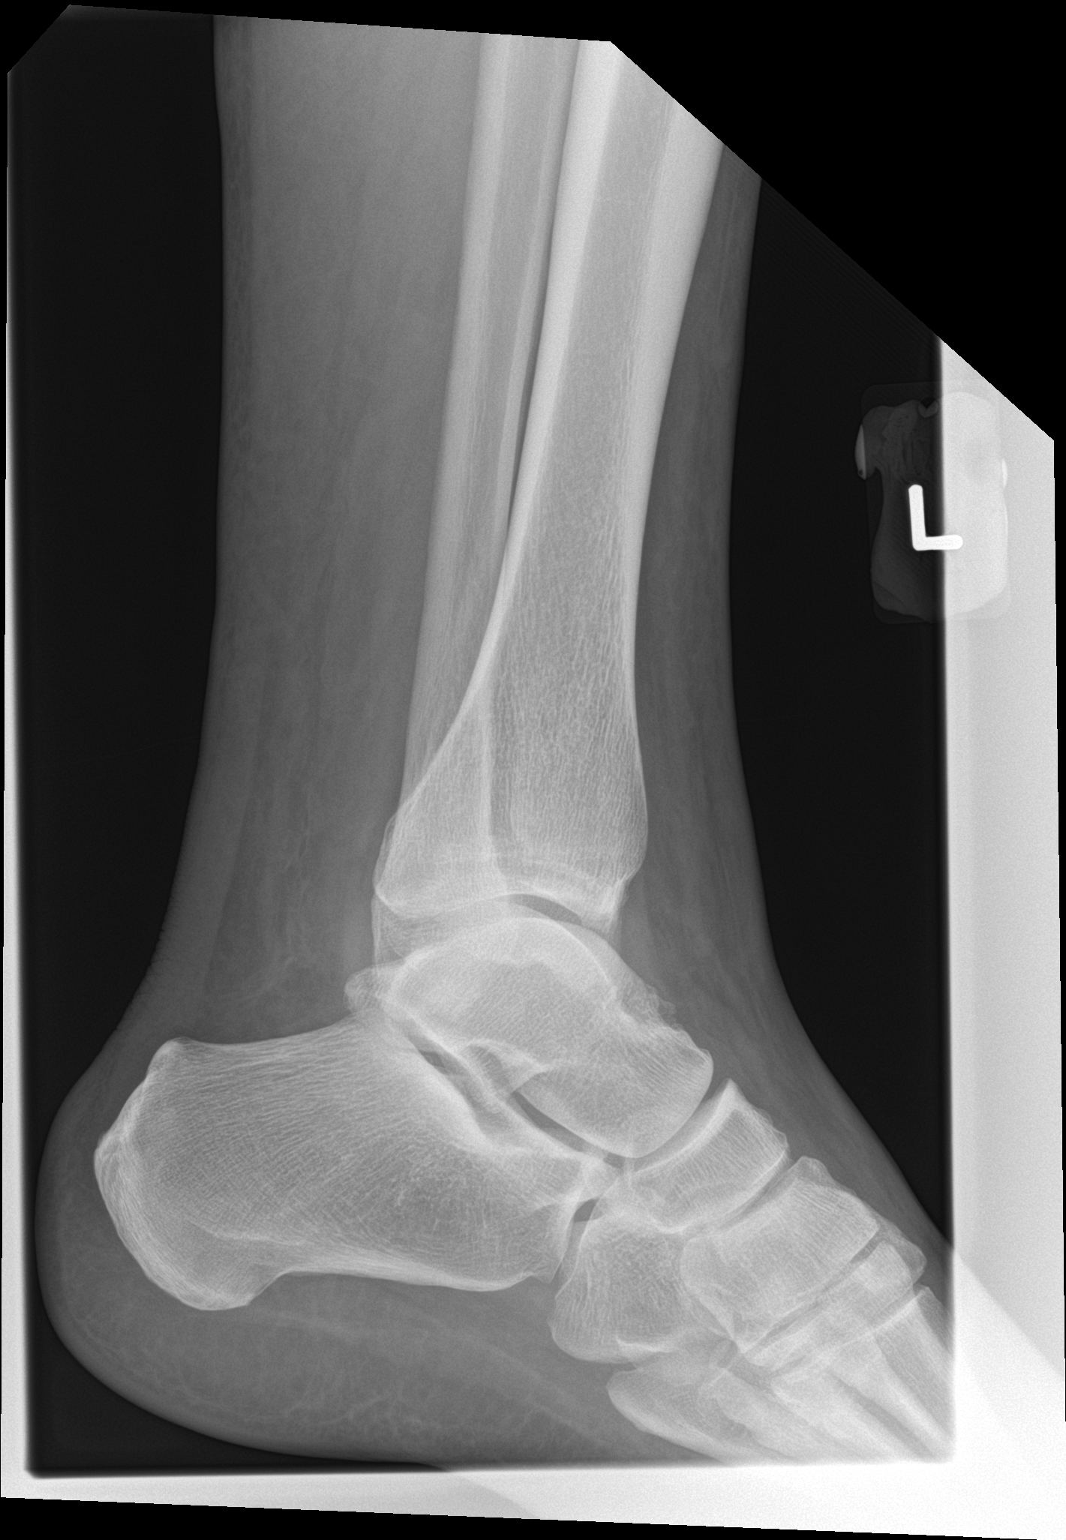

[3 of 3 positions shown; findings below may reference images not displayed]

FINDINGS: No fracture or dislocation is seen.

The ankle mortise is intact.

The base of the fifth metatarsal is unremarkable.

Mild lateral soft tissue swelling.
IMPRESSION: No fracture or dislocation is seen.

Mild lateral soft tissue swelling.
